# Patient Record
Sex: Male | Born: 1965 | Race: White | Hispanic: No | Marital: Single | Smoking: Current every day smoker
Health system: Southern US, Community
[De-identification: ages and names within clinical notes are randomized; demographics above are authoritative.]

## PROBLEM LIST (undated history)

## (undated) DIAGNOSIS — I10 Essential (primary) hypertension: Secondary | ICD-10-CM

## (undated) DIAGNOSIS — F419 Anxiety disorder, unspecified: Secondary | ICD-10-CM

## (undated) DIAGNOSIS — E785 Hyperlipidemia, unspecified: Secondary | ICD-10-CM

## (undated) DIAGNOSIS — E669 Obesity, unspecified: Secondary | ICD-10-CM

## (undated) DIAGNOSIS — F17200 Nicotine dependence, unspecified, uncomplicated: Secondary | ICD-10-CM

## (undated) DIAGNOSIS — G4730 Sleep apnea, unspecified: Secondary | ICD-10-CM

## (undated) HISTORY — DX: Nicotine dependence, unspecified, uncomplicated: F17.200

## (undated) HISTORY — DX: Anxiety disorder, unspecified: F41.9

## (undated) HISTORY — PX: PARTIAL NEPHRECTOMY: SHX414

## (undated) HISTORY — DX: Sleep apnea, unspecified: G47.30

## (undated) HISTORY — DX: Hyperlipidemia, unspecified: E78.5

## (undated) HISTORY — DX: Obesity, unspecified: E66.9

---

## 2002-01-14 ENCOUNTER — Ambulatory Visit (HOSPITAL_BASED_OUTPATIENT_CLINIC_OR_DEPARTMENT_OTHER): Admission: RE | Admit: 2002-01-14 | Discharge: 2002-01-14 | Payer: Self-pay | Admitting: Internal Medicine

## 2002-03-11 ENCOUNTER — Ambulatory Visit (HOSPITAL_BASED_OUTPATIENT_CLINIC_OR_DEPARTMENT_OTHER): Admission: RE | Admit: 2002-03-11 | Discharge: 2002-03-11 | Payer: Self-pay | Admitting: Internal Medicine

## 2004-06-12 ENCOUNTER — Ambulatory Visit: Payer: Self-pay | Admitting: *Deleted

## 2004-07-17 ENCOUNTER — Ambulatory Visit: Payer: Self-pay | Admitting: Family Medicine

## 2004-10-03 ENCOUNTER — Ambulatory Visit: Payer: Self-pay | Admitting: Family Medicine

## 2005-05-15 ENCOUNTER — Encounter: Admission: RE | Admit: 2005-05-15 | Discharge: 2005-05-15 | Payer: Self-pay | Admitting: Family Medicine

## 2005-05-23 ENCOUNTER — Ambulatory Visit: Payer: Self-pay

## 2006-05-22 ENCOUNTER — Ambulatory Visit: Payer: Self-pay | Admitting: Family Medicine

## 2007-01-27 ENCOUNTER — Ambulatory Visit: Payer: Self-pay | Admitting: Family Medicine

## 2007-09-25 HISTORY — PX: ANKLE SURGERY: SHX546

## 2007-10-14 ENCOUNTER — Ambulatory Visit: Payer: Self-pay | Admitting: Family Medicine

## 2007-10-14 ENCOUNTER — Encounter: Admission: RE | Admit: 2007-10-14 | Discharge: 2007-10-14 | Payer: Self-pay | Admitting: Family Medicine

## 2007-11-20 ENCOUNTER — Ambulatory Visit: Payer: Self-pay | Admitting: Family Medicine

## 2008-01-21 ENCOUNTER — Ambulatory Visit (HOSPITAL_COMMUNITY): Admission: RE | Admit: 2008-01-21 | Discharge: 2008-01-22 | Payer: Self-pay | Admitting: Orthopedic Surgery

## 2008-02-17 ENCOUNTER — Ambulatory Visit: Payer: Self-pay | Admitting: Family Medicine

## 2008-09-22 ENCOUNTER — Ambulatory Visit: Payer: Self-pay | Admitting: Family Medicine

## 2009-01-31 ENCOUNTER — Ambulatory Visit: Payer: Self-pay | Admitting: Family Medicine

## 2009-02-02 ENCOUNTER — Ambulatory Visit: Payer: Self-pay | Admitting: Family Medicine

## 2009-03-09 ENCOUNTER — Ambulatory Visit: Payer: Self-pay | Admitting: Family Medicine

## 2009-06-23 ENCOUNTER — Ambulatory Visit: Payer: Self-pay | Admitting: Family Medicine

## 2010-09-13 ENCOUNTER — Ambulatory Visit: Payer: Self-pay | Admitting: Family Medicine

## 2010-12-12 ENCOUNTER — Encounter: Payer: Self-pay | Admitting: Family Medicine

## 2011-01-11 ENCOUNTER — Encounter: Payer: Self-pay | Admitting: Family Medicine

## 2011-01-17 ENCOUNTER — Encounter: Payer: Self-pay | Admitting: *Deleted

## 2011-01-22 ENCOUNTER — Encounter: Payer: Self-pay | Admitting: Medical

## 2011-01-30 ENCOUNTER — Encounter: Payer: Self-pay | Admitting: Medical

## 2011-01-30 ENCOUNTER — Ambulatory Visit (INDEPENDENT_AMBULATORY_CARE_PROVIDER_SITE_OTHER): Payer: BC Managed Care – PPO | Admitting: Medical

## 2011-01-30 VITALS — BP 110/72 | HR 72 | Ht 66.0 in | Wt 252.0 lb

## 2011-01-30 DIAGNOSIS — E669 Obesity, unspecified: Secondary | ICD-10-CM | POA: Insufficient documentation

## 2011-01-30 DIAGNOSIS — F17200 Nicotine dependence, unspecified, uncomplicated: Secondary | ICD-10-CM

## 2011-01-30 DIAGNOSIS — E785 Hyperlipidemia, unspecified: Secondary | ICD-10-CM

## 2011-01-30 DIAGNOSIS — F430 Acute stress reaction: Secondary | ICD-10-CM | POA: Insufficient documentation

## 2011-01-30 LAB — POCT URINALYSIS DIPSTICK
Bilirubin, UA: NEGATIVE
Glucose, UA: NEGATIVE
Ketones, UA: NEGATIVE
Protein, UA: NEGATIVE
Urobilinogen, UA: NEGATIVE

## 2011-01-30 MED ORDER — EZETIMIBE-SIMVASTATIN 10-80 MG PO TABS: 1.0000 | ORAL_TABLET | Freq: Every day | ORAL | Status: DC

## 2011-01-30 MED ORDER — LISINOPRIL 5 MG PO TABS: 5.0000 mg | ORAL_TABLET | Freq: Every day | ORAL | Status: DC

## 2011-01-30 MED ORDER — SAXAGLIPTIN-METFORMIN ER 2.5-1000 MG PO TB24: 1.0000 | ORAL_TABLET | Freq: Two times a day (BID) | ORAL | Status: DC

## 2011-01-30 NOTE — Progress Notes (Signed)
  Subjective:    Patient ID: Ralph Swanson, male    DOB: 08/18/1966, 45 y.o.   MRN: [id number]  HPI Here today for routine f/u on diabetes, cholesterol, medications.  He was last here 12/11, and at that time had HgbA1C of 10%.  He was changed to Kombiglyze 2.5/1000mg  BID at that time.  He notes that he has tried to improve his diet, but not getting much exercise.  He is past due for eye doctor visit as well.   His main problem in taking care of himself is his stressors.  He lives alone, but takes care of both his elderly parents.  His father's memory is declining, and his mother has poorly controlled diabetes, and will soon be going on dialysis.  He still smokes.  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  His parents have been reluctant to have any aid workers help them.  He works in Air[employer].  He is very stressed given all the different responsibilities he has.  He is limited in exercise due to ankle issues.  Seeing ortho for this.  No other c/o today.  Other prior diabetic medications include Byetta and Glipizide.    Review of Systems  Constitutional: Negative for fever, chills, fatigue and unexpected weight change.  Eyes: Negative for visual disturbance.  Respiratory: Negative for cough and shortness of breath.   Cardiovascular: Negative for chest pain, palpitations and leg swelling.  Gastrointestinal: Negative for nausea, vomiting, abdominal pain, diarrhea and constipation.  Genitourinary: Negative for dysuria, urgency and frequency.  Neurological: Negative for weakness and numbness.       Objective:   Physical Exam  Constitutional: He appears well-developed and well-nourished. No distress.       obese  HENT:  Mouth/Throat: Oropharynx is clear and moist.  Eyes: Conjunctivae and EOM are normal. Pupils are equal, round, and reactive to light.  Neck: Normal range of motion. Neck supple. No JVD present. No thyromegaly present.  Cardiovascular: Normal rate, regular rhythm and  intact distal pulses.   No murmur heard. Pulmonary/Chest: Effort normal and breath sounds normal. He has no wheezes. He has no rales.  Abdominal: Soft. Bowel sounds are normal. He exhibits no distension and no mass. There is no tenderness. There is no rebound and no guarding.  Musculoskeletal: He exhibits no edema.  Lymphadenopathy:    He has no cervical adenopathy.  Neurological:       Foot sensation with monofilament exam normal  Skin: Skin is warm and dry.       No foot lesions          Assessment & Plan:   Encounter Diagnoses  Name Primary?  . Type II or unspecified type diabetes mellitus without mention of complication, uncontrolled Yes  . Hyperlipidemia   . Tobacco use disorder   . Obesity   . Acute stress reaction      Diabetes - improved, but we need to work on your weight and diet and exercise.  Get 30-40 minutes exercise daily, eat healthy - less fast food, red meat; more fruits, vegetables, smaller portions.  Get a yearly diabetic eye exam soon.  Check feet for sores daily.  Check fasting glucose daily and keep a record of these.  Continue same medications for now.  Try and cut back on tobacco and eventually quit.  Return in 3 months for recheck.

## 2011-01-30 NOTE — Progress Notes (Signed)
  Subjective:    Patient ID: Ralph Swanson, male    DOB: 12-Feb-1966, 45 y.o.   MRN: [id number]  HPI    Review of Systems     Objective:   Physical Exam        Assessment & Plan:

## 2011-01-30 NOTE — Patient Instructions (Signed)
Diabetes - improved, but we need to work on your weight and diet and exercise.    Get 30-40 minutes exercise daily, eat healthy - less fast food, red meat; more fruits, vegetables, smaller portions.    Get a yearly diabetic eye exam soon.  Check feet for sores daily.  Check fasting glucose daily and keep a record of these.    Continue same medications for now.    Return in 3 months for recheck.

## 2011-01-31 LAB — LIPID PANEL
Cholesterol: 85 mg/dL (ref 0–200)
Total CHOL/HDL Ratio: 2.7 Ratio
VLDL: 19 mg/dL (ref 0–40)

## 2011-01-31 LAB — COMPREHENSIVE METABOLIC PANEL
ALT: 14 U/L (ref 0–53)
AST: 17 U/L (ref 0–37)
Calcium: 9.1 mg/dL (ref 8.4–10.5)
Creat: 0.69 mg/dL (ref 0.40–1.50)
Potassium: 4.2 mEq/L (ref 3.5–5.3)

## 2011-02-01 ENCOUNTER — Telehealth: Payer: Self-pay | Admitting: *Deleted

## 2011-02-01 NOTE — Telephone Encounter (Addendum)
Message copied by Ellsworth Lennox on Thu Feb 01, 2011  9:07 AM ------      Message from: Aleen Campi, DAVID      Created: Wed Jan 31, 2011  2:43 PM       Similar to last labs, his good chol is low, bad chol is ok.  Liver, kidneys, lytes ok.  C/t same meds, work on diet and exercise, recheck 43mo.     Pt. Informed of lab results.  Advised patient to continue same medications, work on diet and exercise.  Pt scheduled on 05-02-11 for a 3 month follow up.  CM,LPN

## 2011-02-06 NOTE — Op Note (Signed)
NAMEAVIGDOR, DOLLAR                 ACCOUNT NO.:  [id number]   MEDICAL RECORD NO.:  [id number]          PATIENT TYPE:  OIB   LOCATION:  5030                         FACILITY:  [facility]   PHYSICIAN:  Nadara Mustard, MD     DATE OF BIRTH:  1966-09-13   DATE OF PROCEDURE:  01/21/2008  DATE OF DISCHARGE:                               OPERATIVE REPORT   PREOPERATIVE DIAGNOSIS:  Osteoarthritis right ankle with impingement  syndrome.   POSTOPERATIVE DIAGNOSIS:  Osteoarthritis right ankle with impingement  syndrome.   PROCEDURE:  Right ankle arthroscopy with debridement and decompression.   SURGEON:  Nadara Mustard, MD   ANESTHESIA:  General.   ESTIMATED BLOOD LOSS:  Minimal.   ANTIBIOTICS:  None.   DRAINS:  None.   COMPLICATIONS:  None.   TOURNIQUET TIME:  Esmarch at the ankle for approximately 15 minutes.   DISPOSITION:  To PACU in stable condition.   INDICATIONS FOR PROCEDURE:  The patient is a 45 year old gentleman with  impingement and arthritis of his right ankle.  He has failed  conservative care for his pain with activities of daily living and  presents at this time for arthroscopic intervention.  Risks and benefits  were discussed including infection, neurovascular injury, persistent  pain, and need for additional surgery.  The patient states he  understands and wished to proceed at this time.   DESCRIPTION OF PROCEDURE:  The patient was brought to OR room 10 and  underwent general anesthetic.  After adequate level of anesthesia was  obtained, the patient's right lower extremity was prepped using  DuraPrep, draped into a sterile field and the sling for the ankle  distractor was used to apply 10 pounds of ankle distraction.  An  anteromedial portal was made and localized with an 18-gauge spinal.  The  joint was infused with saline.  The skin was incised.  Blunt dissection  was carried down to the joint and the blunt trocar was used to insert  the scope.  The lateral  portal was used with light from the inside,  identifying the neurovascular bundles.  The skin was incised, blunt  dissection was carried down to the capsule and a blunt trocar was used  to make the anterior lateral portal.  Visualization showed a significant  amount of synovitis.  This was debrided with the shaver and the vapor  wand.  Further debridement was performed with the shaver.  The medial  and lateral gutters were cleansed, articular cartilage was probed and  stable.  No full-thickness articular cartilage defect.  After  debridement and decompression, the instruments were removed.  The  portals were closed using 3-0 nylon and wounds were covered with Adaptic  orthopedic sponges, sterile Webril, and a Coban dressing.  The patient  was extubated and taken to PACU in stable condition.  Plan for 24-hour  observation for his history of sleep apnea with CPAP per respiratory  therapy.  Discharge in the morning.      Nadara Mustard, MD  Electronically Signed     MVD/MEDQ  D:  01/21/2008  T:  01/22/2008  Job:  [id number]

## 2011-02-26 ENCOUNTER — Other Ambulatory Visit: Payer: Self-pay | Admitting: Medical

## 2011-02-26 DIAGNOSIS — E785 Hyperlipidemia, unspecified: Secondary | ICD-10-CM

## 2011-02-26 DIAGNOSIS — E119 Type 2 diabetes mellitus without complications: Secondary | ICD-10-CM

## 2011-02-26 MED ORDER — SAXAGLIPTIN-METFORMIN ER 2.5-1000 MG PO TB24: 1.0000 | ORAL_TABLET | Freq: Two times a day (BID) | ORAL | Status: DC

## 2011-02-26 MED ORDER — EZETIMIBE-SIMVASTATIN 10-80 MG PO TABS: 1.0000 | ORAL_TABLET | Freq: Every day | ORAL | Status: DC

## 2011-02-26 NOTE — Telephone Encounter (Signed)
Was given written rx but has to go to [facility]. Took rx to local pharmacy but ins co will not let him fill long term rx's locally

## 2011-02-27 ENCOUNTER — Other Ambulatory Visit: Payer: Self-pay | Admitting: Medical

## 2011-02-27 DIAGNOSIS — E119 Type 2 diabetes mellitus without complications: Secondary | ICD-10-CM

## 2011-02-27 MED ORDER — SAXAGLIPTIN-METFORMIN ER 2.5-1000 MG PO TB24: 1.0000 | ORAL_TABLET | Freq: Two times a day (BID) | ORAL | Status: DC

## 2011-05-02 ENCOUNTER — Ambulatory Visit: Payer: BC Managed Care – PPO | Admitting: Medical

## 2011-06-19 LAB — BASIC METABOLIC PANEL
BUN: 12
CO2: 27
Chloride: 103
Creatinine, Ser: 0.71
GFR calc non Af Amer: >60
Glucose, Bld: 94
Potassium: 4.2
Sodium: 139

## 2011-06-19 LAB — CBC
HCT: 45.7
MCV: 94.1
Platelets: 218

## 2011-06-20 ENCOUNTER — Telehealth: Payer: Self-pay | Admitting: Medical

## 2011-06-20 ENCOUNTER — Ambulatory Visit (INDEPENDENT_AMBULATORY_CARE_PROVIDER_SITE_OTHER): Payer: BC Managed Care – PPO | Admitting: Medical

## 2011-06-20 ENCOUNTER — Encounter: Payer: Self-pay | Admitting: Medical

## 2011-06-20 VITALS — BP 122/70 | HR 66 | Temp 98.1°F | Resp 18 | Ht <= 58 in | Wt 248.0 lb

## 2011-06-20 DIAGNOSIS — R809 Proteinuria, unspecified: Secondary | ICD-10-CM

## 2011-06-20 DIAGNOSIS — F17200 Nicotine dependence, unspecified, uncomplicated: Secondary | ICD-10-CM

## 2011-06-20 DIAGNOSIS — E785 Hyperlipidemia, unspecified: Secondary | ICD-10-CM

## 2011-06-20 DIAGNOSIS — E669 Obesity, unspecified: Secondary | ICD-10-CM

## 2011-06-20 DIAGNOSIS — E119 Type 2 diabetes mellitus without complications: Secondary | ICD-10-CM

## 2011-06-20 DIAGNOSIS — Z23 Encounter for immunization: Secondary | ICD-10-CM

## 2011-06-20 LAB — POCT URINALYSIS DIPSTICK
Blood, UA: NEGATIVE
Spec Grav, UA: 1.025

## 2011-06-20 MED ORDER — SAXAGLIPTIN-METFORMIN ER 5-1000 MG PO TB24: 1.0000 | ORAL_TABLET | Freq: Two times a day (BID) | ORAL | Status: DC

## 2011-06-20 MED ORDER — LISINOPRIL 5 MG PO TABS: 5.0000 mg | ORAL_TABLET | Freq: Every day | ORAL | Status: DC

## 2011-06-20 MED ORDER — GLIPIZIDE 5 MG PO TABS: 5.0000 mg | ORAL_TABLET | Freq: Two times a day (BID) | ORAL | Status: DC

## 2011-06-20 NOTE — Patient Instructions (Addendum)
Your diabetes marker was 14.0% today.  This is not good.   I want you to check your glucose EVERY day, especially the fasting morning reading.  Keep a record of these in a notebook and bring them next visit in 3 months.  The goal is morning glucose <130.   I increased your Kombiglyze to 01/999 twice daily.  I added Glipizide 5mg  twice daily.    I want you exercising DAILY.  Watch your diet carefully.  Eat 3-5 snack size meals daily including fruits, vegetables, and lean meat such as [place], chicken, and fish.  Avoid large portions, avoid lots of bread/rice/cereal etc.  Avoid fast food, sweet tea, cola, etc.     I signed you up for the diabetes education session in October.

## 2011-06-20 NOTE — Progress Notes (Signed)
Subjective:   HPI  Ralph Swanson is a 45 y.o. male who presents for recheck.   Last saw him in 01/30/11 for general recheck.    Here for recheck on diabetes and weight.  Last visit he had improved with his diet and medication, had just been started on Kombiglyze 2.01/999 BID prior to last visit.  However, he takes care of his elderly parents, and things continue to be busy and hectic.  Since last visit his mother has had pacemaker put in, has fistula, had colonoscopy, and father ended up also having to have pacemaker as well.  Brother just retired and is coming down for a month to help with parents.  He reports that he has not done a good job with his diet, not exercising at all, but he is taking his medications.  Checks his feet daily, but he does not check his glucose regularly at all. He does have a glucometer and supplies to check his sugar.  He has a treadmill in storage, just has not had time to get out and exercise. No other c/o.  The following portions of the patient's history were reviewed and updated as appropriate: allergies, current medications, past family history, past medical history, past social history, past surgical history and problem list.  Past Medical History  Diagnosis Date  . Diabetes mellitus   . Sleep apnea   . Obesity   . Dyslipidemia   . Smoker    No past surgical history on file.  Current Outpatient Prescriptions on File Prior to Visit  Medication Sig Dispense Refill  . aspirin 81 MG tablet Take 81 mg by mouth daily.        Marland Kitchen ezetimibe-simvastatin (VYTORIN) 10-80 MG per tablet Take 1 tablet by mouth at bedtime.  30 tablet  5  . Multiple Vitamin (MULTIVITAMIN) tablet Take 1 tablet by mouth daily.          Review of Systems Constitutional: denies fever, chills, sweats, unexpected weight change, anorexia, fatigue Cardiology: denies chest pain, palpitations, edema Respiratory: denies cough, shortness of breath, wheezing Gastroenterology: denies abdominal pain,  nausea, vomiting, diarrhea, constipation Musculoskeletal: denies arthralgias, myalgias, joint swelling, back pain Ophthalmology: denies vision changes Urology: denies dysuria, difficulty urinating, hematuria, urinary frequency, urgency Neurology: no headache, weakness, tingling, numbness Psychology: denies depressed mood, agitation, sleep problems     Objective:   Physical Exam  General appearance: alert, no distress, WD/WN, white male, obese Skin: no foot lesions Neck: supple, no lymphadenopathy, no thyromegaly, no masses, no bruits Heart: RRR, normal S1, S2, no murmurs Lungs: CTA bilaterally, no wheezes, rhonchi, or rales Extremities: no edema, no cyanosis, no clubbing Pulses: 2+ symmetric, upper and lower extremities, normal cap refill Neurological: normal foot sensation with monofilament   Assessment :    Encounter Diagnoses  Name Primary?  . Type II or unspecified type diabetes mellitus without mention of complication, not stated as uncontrolled Yes  . Obesity   . Need for prophylactic vaccination and inoculation against influenza   . Hyperlipidemia   . Tobacco use disorder   . Microalbuminuria      Plan:    Diabetes Mellitus Type II - unfortunately, his HgbA1C has jumped back up to 14%.  He was 10% in December, had improved with diet and medication compliance to 7.5% last visit, but in the last few months, he has not been compliant with diet or exercise.   We discussed the possibility of complications, the need to really get a handle on his diabetes.  He agrees to see diabetic education counselor here in October, and I advised that he needs to get exercise every day, really has to get a handle on his diet, and we made some medication modifications today.   I suggested we go ahead and start insulin, but he declines.   We discussed the various types of medications for diabetes.  I will increase his Kombiglyze to 01/999 twice a day, and I'll add Glucotrol 5 mg twice a day.  Discussed the possibility of hypoglycemic episodes, particularly if he is skipping meals or doesn't eat at times.  I advise that I want him checking his sugars every single day, keep a log of this in a note book, bring numbers back in next visit.  Goal is less than 130 in the morning.  Recheck in 3 months.  Obesity-advise diet changes, exercise.  VIS and flu vaccine given today. He reports having the pneumococcal vaccine in the past but can't recall the date.  Hyperlipidemia-continue same medicines  Tobacco use-continue efforts to stop smoking  Microalbuminuria-12/11 on labs, continue lisinopril

## 2011-06-21 ENCOUNTER — Telehealth: Payer: Self-pay | Admitting: Family Medicine

## 2011-06-22 ENCOUNTER — Telehealth: Payer: Self-pay | Admitting: Medical

## 2011-06-25 ENCOUNTER — Telehealth: Payer: Self-pay | Admitting: Family Medicine

## 2011-06-25 ENCOUNTER — Other Ambulatory Visit: Payer: Self-pay | Admitting: Medical

## 2011-06-25 MED ORDER — SAXAGLIPTIN-METFORMIN ER 5-1000 MG PO TB24: 1.0000 | ORAL_TABLET | Freq: Every day | ORAL | Status: DC

## 2011-06-25 NOTE — Telephone Encounter (Signed)
Done

## 2011-06-25 NOTE — Telephone Encounter (Signed)
DONE

## 2011-06-25 NOTE — Telephone Encounter (Addendum)
Message copied by Janeice Robinson on Mon Jun 25, 2011  9:06 AM ------      Message from: Allen, Kermit Balo      Created: Mon Jun 25, 2011  8:31 AM       The max dose on Kombiglyze 01/999 XR is once daily, thus, I adjusted this per his pharmacy request.  So he will be taking Kombiglyze 01/999 XR once daily, Glucotrol 5mg  BID, both of those for diabetes, along with his other medications.              I want him to check glucose every morning before breakfast, write these numbers down, and send/mail/fax/drop off glucose readings in 3-4 wk.    Patient was notified of the change in his medication and to F/U on his glucose readings. cls

## 2011-06-25 NOTE — Telephone Encounter (Signed)
done

## 2011-07-11 NOTE — Telephone Encounter (Signed)
dt ?

## 2011-10-16 ENCOUNTER — Telehealth: Payer: Self-pay | Admitting: Internal Medicine

## 2011-10-17 ENCOUNTER — Other Ambulatory Visit: Payer: Self-pay | Admitting: Medical

## 2011-10-17 DIAGNOSIS — E785 Hyperlipidemia, unspecified: Secondary | ICD-10-CM

## 2011-10-17 MED ORDER — EZETIMIBE-SIMVASTATIN 10-80 MG PO TABS: 1.0000 | ORAL_TABLET | Freq: Every day | ORAL | Status: DC

## 2011-10-17 MED ORDER — SAXAGLIPTIN-METFORMIN ER 5-1000 MG PO TB24: 1.0000 | ORAL_TABLET | Freq: Two times a day (BID) | ORAL | Status: DC

## 2011-10-17 MED ORDER — LISINOPRIL 5 MG PO TABS: 5.0000 mg | ORAL_TABLET | Freq: Every day | ORAL | Status: DC

## 2011-10-17 MED ORDER — GLIPIZIDE 5 MG PO TABS: 5.0000 mg | ORAL_TABLET | Freq: Two times a day (BID) | ORAL | Status: DC

## 2011-10-17 NOTE — Telephone Encounter (Signed)
done

## 2011-11-09 ENCOUNTER — Encounter: Payer: BC Managed Care – PPO | Admitting: Medical

## 2011-11-14 ENCOUNTER — Telehealth: Payer: Self-pay | Admitting: Family Medicine

## 2011-11-14 NOTE — Telephone Encounter (Signed)
Done

## 2011-11-23 ENCOUNTER — Encounter: Payer: Self-pay | Admitting: Medical

## 2011-11-23 ENCOUNTER — Ambulatory Visit (INDEPENDENT_AMBULATORY_CARE_PROVIDER_SITE_OTHER): Payer: BC Managed Care – PPO | Admitting: Medical

## 2011-11-23 VITALS — BP 112/68 | HR 72 | Temp 98.3°F | Resp 16 | Wt 269.0 lb

## 2011-11-23 DIAGNOSIS — E669 Obesity, unspecified: Secondary | ICD-10-CM

## 2011-11-23 DIAGNOSIS — M25579 Pain in unspecified ankle and joints of unspecified foot: Secondary | ICD-10-CM

## 2011-11-23 DIAGNOSIS — R454 Irritability and anger: Secondary | ICD-10-CM

## 2011-11-23 DIAGNOSIS — E785 Hyperlipidemia, unspecified: Secondary | ICD-10-CM

## 2011-11-23 DIAGNOSIS — M25571 Pain in right ankle and joints of right foot: Secondary | ICD-10-CM

## 2011-11-23 DIAGNOSIS — Z87891 Personal history of nicotine dependence: Secondary | ICD-10-CM

## 2011-11-23 LAB — POCT GLYCOSYLATED HEMOGLOBIN (HGB A1C): Hemoglobin A1C: 7.2

## 2011-11-23 MED ORDER — SERTRALINE HCL 25 MG PO TABS: 25.0000 mg | ORAL_TABLET | Freq: Every day | ORAL | Status: DC

## 2011-11-23 MED ORDER — GLIPIZIDE 5 MG PO TABS: 5.0000 mg | ORAL_TABLET | Freq: Two times a day (BID) | ORAL | Status: DC

## 2011-11-23 MED ORDER — LISINOPRIL 5 MG PO TABS: 5.0000 mg | ORAL_TABLET | Freq: Every day | ORAL | Status: DC

## 2011-11-23 MED ORDER — DICLOFENAC SODIUM 75 MG PO TBEC: 75.0000 mg | DELAYED_RELEASE_TABLET | Freq: Two times a day (BID) | ORAL | Status: DC

## 2011-11-23 MED ORDER — SIMVASTATIN 40 MG PO TABS: 40.0000 mg | ORAL_TABLET | Freq: Every day | ORAL | Status: DC

## 2011-11-23 MED ORDER — SAXAGLIPTIN-METFORMIN ER 5-1000 MG PO TB24: 1.0000 | ORAL_TABLET | Freq: Every day | ORAL | Status: DC

## 2011-11-23 NOTE — Patient Instructions (Signed)
We will refer you for your ankle pain.  Lets recheck, fasting in 6 weeks.

## 2011-11-23 NOTE — Progress Notes (Signed)
Subjective:   HPI  Ralph Swanson is a 46 y.o. male who presents for recheck on chronic issues - diabetes, cholesterol, tobacco, weight.  As of last visit in May, he did not attend the diabetes educational counseling session as he agreed to.  He has gained weight, but he finally was able to stop smoking.  Stopped smoking 2 months ago.  Since stopping tobacco, he has been more irritable.  He says he is making diet changes.   Eating more celery, almonds, healthier choices.  Due to insurance issues, he has been out of Vytorin 2 mo and running out of Kombiglyze.  3 years ago had surgery on his ankle .  Since then he has had gradual return to the same pain.   2 nights ago couldn't stand due to right ankle.  Feels like bone grinding on bone.  Feels inflamed.  When this hurts, he shifts his weight then starts getting pain in left hip, and feels like he can't move.  Certainly can't exercise currently.   He had been on Zoloft prior for irritability and would like to try this again.    No other c/o.  The following portions of the patient's history were reviewed and updated as appropriate: allergies, current medications, past family history, past medical history, past social history, past surgical history and problem list.  Past Medical History  Diagnosis Date  . Diabetes mellitus   . Sleep apnea   . Obesity   . Dyslipidemia   . Smoker     No Known Allergies   Review of Systems ROS reviewed and was negative other than noted in HPI or above.    Objective:   Physical Exam  General appearance: alert, no distress, WD/WN Oral cavity: MMM, no lesions Neck: supple, no lymphadenopathy, no thyromegaly, no masses Heart: RRR, normal S1, S2, no murmurs Lungs: CTA bilaterally, no wheezes, rhonchi, or rales Abdomen: +bs, soft, non tender, non distended, no masses, no hepatomegaly, no splenomegaly Pulses: 2+ symmetric, upper and lower extremities, normal cap refill MSK: flat footed, right ankle non  particularly tender on palpation, but pain with ambulation and weight bearing, no obvious swelling.  Left foot normal exam other than flat foot.  Rest of LE exam unremarkable.  Neuro: normal monofilament exam   Assessment and Plan :     Encounter Diagnoses  Name Primary?  . Type II or unspecified type diabetes mellitus without mention of complication, uncontrolled Yes  . Hyperlipidemia   . Obesity   . Former smoker   . Ankle pain, right   . Irritability    Diabetes type II - HgbA1C 7.2%.  C/t same medication, including Glucotrol 5 BID and Kombiglyze XR 01/999 daily unless we can't get it approved.  Needs to work on diet and exercise.    Lipids - change to Simvastatin 40mg  due to cost, recheck 6wk.  Congratulated him on smoking cessation.  Ankle pain/foot pain - crutches for now, referral to podiatry.  Reviewed prior ortho notes.  Voltaren script, he has crutches, note for work to allow crutches.   Irritability - begin Zoloft, recheck 6 wk.

## 2011-11-27 ENCOUNTER — Telehealth: Payer: Self-pay | Admitting: Medical

## 2011-12-14 ENCOUNTER — Telehealth: Payer: Self-pay | Admitting: Medical

## 2011-12-17 ENCOUNTER — Telehealth: Payer: Self-pay | Admitting: Medical

## 2011-12-17 NOTE — Telephone Encounter (Signed)
DONE

## 2011-12-18 ENCOUNTER — Other Ambulatory Visit: Payer: Self-pay | Admitting: Internal Medicine

## 2011-12-18 MED ORDER — SERTRALINE HCL 25 MG PO TABS: 25.0000 mg | ORAL_TABLET | Freq: Every day | ORAL | Status: DC

## 2011-12-18 NOTE — Telephone Encounter (Signed)
Just did it as a 90 day supply

## 2011-12-19 ENCOUNTER — Other Ambulatory Visit: Payer: Self-pay | Admitting: Medical

## 2011-12-19 MED ORDER — DICLOFENAC SODIUM 75 MG PO TBEC: 75.0000 mg | DELAYED_RELEASE_TABLET | Freq: Two times a day (BID) | ORAL | Status: DC

## 2011-12-20 ENCOUNTER — Telehealth: Payer: Self-pay | Admitting: Internal Medicine

## 2011-12-24 ENCOUNTER — Other Ambulatory Visit: Payer: Self-pay | Admitting: Medical

## 2011-12-24 MED ORDER — DICLOFENAC SODIUM 75 MG PO TBEC: 75.0000 mg | DELAYED_RELEASE_TABLET | Freq: Two times a day (BID) | ORAL | Status: DC

## 2011-12-24 NOTE — Telephone Encounter (Signed)
Patient was notified of the medication that was sent to the pharmacy. CLS

## 2011-12-24 NOTE — Telephone Encounter (Signed)
i sent the 90 day supply, but I recommend he make these last longer than 3 months.   Taking these every single day BID or any other antiinflammatory such as Aleve, Ibuprofen, etc., can put you at risk for a bleed in the stomach/GI tract for example.    We can discuss further at next visit as well as safer options.

## 2012-01-04 ENCOUNTER — Encounter: Payer: Self-pay | Admitting: Medical

## 2012-01-04 ENCOUNTER — Ambulatory Visit (INDEPENDENT_AMBULATORY_CARE_PROVIDER_SITE_OTHER): Payer: BC Managed Care – PPO | Admitting: Medical

## 2012-01-04 VITALS — BP 120/70 | HR 76 | Temp 98.1°F | Resp 16 | Wt 275.0 lb

## 2012-01-04 DIAGNOSIS — F411 Generalized anxiety disorder: Secondary | ICD-10-CM

## 2012-01-04 DIAGNOSIS — M25579 Pain in unspecified ankle and joints of unspecified foot: Secondary | ICD-10-CM

## 2012-01-04 DIAGNOSIS — E785 Hyperlipidemia, unspecified: Secondary | ICD-10-CM

## 2012-01-04 DIAGNOSIS — Z79899 Other long term (current) drug therapy: Secondary | ICD-10-CM

## 2012-01-04 DIAGNOSIS — M25571 Pain in right ankle and joints of right foot: Secondary | ICD-10-CM

## 2012-01-04 DIAGNOSIS — F419 Anxiety disorder, unspecified: Secondary | ICD-10-CM

## 2012-01-04 MED ORDER — DICLOFENAC SODIUM 75 MG PO TBEC: 75.0000 mg | DELAYED_RELEASE_TABLET | Freq: Two times a day (BID) | ORAL | Status: AC

## 2012-01-04 MED ORDER — SERTRALINE HCL 50 MG PO TABS: 50.0000 mg | ORAL_TABLET | Freq: Every day | ORAL | Status: DC

## 2012-01-05 ENCOUNTER — Encounter: Payer: Self-pay | Admitting: Medical

## 2012-01-05 LAB — COMPREHENSIVE METABOLIC PANEL
ALT: 22 U/L (ref 0–53)
AST: 17 U/L (ref 0–37)
Albumin: 4.3 g/dL (ref 3.5–5.2)
CO2: 24 mEq/L (ref 19–32)
Calcium: 8.8 mg/dL (ref 8.4–10.5)
Chloride: 104 mEq/L (ref 96–112)
Creat: 0.73 mg/dL (ref 0.50–1.35)
Potassium: 4.6 mEq/L (ref 3.5–5.3)
Sodium: 139 mEq/L (ref 135–145)
Total Protein: 6.5 g/dL (ref 6.0–8.3)

## 2012-01-05 LAB — LIPID PANEL
Cholesterol: 131 mg/dL (ref 0–200)
HDL: 33 mg/dL — ABNORMAL LOW (ref >39)
LDL Cholesterol: 68 mg/dL (ref 0–99)
Total CHOL/HDL Ratio: 4 ratio
Triglycerides: 150 mg/dL — ABNORMAL HIGH (ref <150)
VLDL: 30 mg/dL (ref 0–40)

## 2012-01-05 NOTE — Progress Notes (Signed)
Subjective:   HPI  Ralph Swanson is a 46 y.o. male who presents for 6 wk f/u.  Last visit we started him back on Zoloft for anxiety.  He continues to deal with multiple stressors; takes care of his parents, has remodeling project at his home, anxiety with his own health, job stress.  He notes that the Zoloft helps, but thinks it probable needs to be higher dose.    Last visit we stopped Vytorin and switched to Simvastatin solely to help with cost.  Here for recheck on labs for this.  He saw podiatry for his chronic right ankle pain recently, had steroid injection, was measured for orthotics and has f/u pending.  He had a really good visit and feels comfortable with the podiatrist.  He also does well with Voltaren and wants to c/t this.    No other c/o.  The following portions of the patient's history were reviewed and updated as appropriate: allergies, current medications, past family history, past medical history, past social history, past surgical history and problem list.  Past Medical History  Diagnosis Date  . Diabetes mellitus   . Sleep apnea   . Obesity   . Dyslipidemia   . Smoker   . Anxiety     No Known Allergies   Review of Systems ROS reviewed and was negative other than noted in HPI or above.    Objective:   Physical Exam  General appearance: alert, no distress, WD/WN Heart: RRR, normal S1, S2, no murmurs Lungs: CTA bilaterally, no wheezes, rhonchi, or rales Pulses: 2+ symmetric  Assessment and Plan :     Encounter Diagnoses  Name Primary?  . Hyperlipidemia Yes  . Anxiety   . Ankle pain, right   . Encounter for long-term (current) use of other medications    Hyperlipidemia - labs today, goal LDL 80.    Anxiety - improving, increase Zoloft to 50mg   Ankle pain - currently followed by Podiatry, has f/u planned  High risk medication - dicussed risks/benefits of Voltaren/NSAIDs for his chronic ankle pain.  Discussed other medication options.  He wants to keep  Voltaren on board for prn use at this time, but hopefully with the new orthotics from podiatry he won't continue needing the NSAID routinely.   F/u pending labs.

## 2012-06-09 ENCOUNTER — Other Ambulatory Visit: Payer: Self-pay | Admitting: Family Medicine

## 2012-06-09 ENCOUNTER — Telehealth: Payer: Self-pay | Admitting: Internal Medicine

## 2012-06-09 MED ORDER — LISINOPRIL 5 MG PO TABS: 5.0000 mg | ORAL_TABLET | Freq: Every day | ORAL | Status: AC

## 2012-06-09 MED ORDER — LISINOPRIL 5 MG PO TABS: 5.0000 mg | ORAL_TABLET | Freq: Every day | ORAL | Status: DC

## 2012-06-09 MED ORDER — GLIPIZIDE 5 MG PO TABS: 5.0000 mg | ORAL_TABLET | Freq: Two times a day (BID) | ORAL | Status: DC

## 2012-06-09 NOTE — Telephone Encounter (Signed)
Called in med to pharamcy

## 2012-06-10 ENCOUNTER — Other Ambulatory Visit: Payer: Self-pay | Admitting: Family Medicine

## 2012-06-10 MED ORDER — SERTRALINE HCL 50 MG PO TABS: 50.0000 mg | ORAL_TABLET | Freq: Every day | ORAL | Status: AC

## 2012-06-10 MED ORDER — SAXAGLIPTIN-METFORMIN ER 5-1000 MG PO TB24: 1.0000 | ORAL_TABLET | Freq: Every day | ORAL | Status: DC

## 2012-06-10 NOTE — Telephone Encounter (Signed)
PLS SIGN OFF ON MEDS 

## 2012-06-17 ENCOUNTER — Telehealth: Payer: Self-pay | Admitting: Family Medicine

## 2012-06-17 NOTE — Telephone Encounter (Signed)
PATIENT HAD CALLED AND SAID HE WAS LOOSING HIS INSURANCE AND IT WOULD BE CHEAPER FOR HIM TO BUY PRAVASTIN INSTEAD OF SIMIVASTIN. PATIENT WOULD LIKE TO KNOW IF HE CAN CHANGE TO PRAVASTIN AND IF SO CAN YOU SEND IN A 90 DAY SUPPLY TO [facility] ON [address]. CLS

## 2012-06-18 ENCOUNTER — Other Ambulatory Visit: Payer: Self-pay | Admitting: Medical

## 2012-06-18 MED ORDER — PRAVASTATIN SODIUM 80 MG PO TABS: 80.0000 mg | ORAL_TABLET | Freq: Every day | ORAL | Status: DC

## 2012-06-18 NOTE — Telephone Encounter (Signed)
Pravastatin sent so stop simvastatin once he runs out.  Needs OV in October, fasting.

## 2012-06-18 NOTE — Telephone Encounter (Signed)
Patient is aware of the medication sent to the pharmacy and to follow up next month. CLS

## 2012-06-23 ENCOUNTER — Telehealth: Payer: Self-pay | Admitting: Internal Medicine

## 2012-06-23 ENCOUNTER — Other Ambulatory Visit: Payer: Self-pay | Admitting: Medical

## 2012-06-23 NOTE — Telephone Encounter (Signed)
i sent pravastatin 80mg  [facility] already, and this is equivalent to the dose of simvastatin he was on.   Going to prvastatin 40mg  would be lowering his dose in general.     Did he go to get the 80mg  from pharmacy?  Is there an issue?  We will need to recheck lipids in 93mo after medication changes.

## 2012-06-24 ENCOUNTER — Other Ambulatory Visit: Payer: Self-pay | Admitting: Medical

## 2012-06-24 MED ORDER — PRAVASTATIN SODIUM 40 MG PO TABS: ORAL_TABLET | ORAL | Status: DC

## 2012-06-24 NOTE — Telephone Encounter (Signed)
Patient states that it is cheaper to get the RX at the 40 mg dose than buying it at the 80 mg dose. He said he will take 2 of the 40 mg to get the 80 mg, but it is cheaper with the lower dose. CLS

## 2012-06-24 NOTE — Telephone Encounter (Signed)
So does this mean we need to call out pravastatin 40, but taking 2 daily?

## 2012-06-24 NOTE — Telephone Encounter (Signed)
YES, RESEND THE RX THAT WAY. CLS

## 2014-02-02 ENCOUNTER — Telehealth: Payer: Self-pay | Admitting: Family Medicine

## 2014-02-02 NOTE — Telephone Encounter (Signed)
[facility] request records

## 2014-04-27 ENCOUNTER — Encounter (HOSPITAL_COMMUNITY): Payer: Self-pay | Admitting: Emergency Medicine

## 2014-04-27 ENCOUNTER — Emergency Department (HOSPITAL_COMMUNITY)
Admission: EM | Admit: 2014-04-27 | Discharge: 2014-04-27 | Disposition: A | Discharge status: Home / Self Care | Payer: BC Managed Care – PPO | Attending: Emergency Medicine | Admitting: Emergency Medicine

## 2014-04-27 ENCOUNTER — Emergency Department (HOSPITAL_COMMUNITY): Payer: BC Managed Care – PPO

## 2014-04-27 DIAGNOSIS — E669 Obesity, unspecified: Secondary | ICD-10-CM | POA: Insufficient documentation

## 2014-04-27 DIAGNOSIS — F17200 Nicotine dependence, unspecified, uncomplicated: Secondary | ICD-10-CM | POA: Insufficient documentation

## 2014-04-27 DIAGNOSIS — Z7982 Long term (current) use of aspirin: Secondary | ICD-10-CM | POA: Insufficient documentation

## 2014-04-27 DIAGNOSIS — E785 Hyperlipidemia, unspecified: Secondary | ICD-10-CM | POA: Insufficient documentation

## 2014-04-27 DIAGNOSIS — Z79899 Other long term (current) drug therapy: Secondary | ICD-10-CM | POA: Insufficient documentation

## 2014-04-27 DIAGNOSIS — R109 Unspecified abdominal pain: Secondary | ICD-10-CM | POA: Insufficient documentation

## 2014-04-27 DIAGNOSIS — E119 Type 2 diabetes mellitus without complications: Secondary | ICD-10-CM | POA: Insufficient documentation

## 2014-04-27 LAB — CBC WITH DIFFERENTIAL/PLATELET
BASOS PCT: 0 % (ref 0–1)
Basophils Absolute: 0 10*3/uL (ref 0.0–0.1)
EOS ABS: 0.2 10*3/uL (ref 0.0–0.7)
EOS PCT: 2 % (ref 0–5)
HEMATOCRIT: 46.7 % (ref 39.0–52.0)
HEMOGLOBIN: 15.8 g/dL (ref 13.0–17.0)
LYMPHS ABS: 2.6 10*3/uL (ref 0.7–4.0)
Lymphocytes Relative: 24 % (ref 12–46)
MCH: 32 pg (ref 26.0–34.0)
MCHC: 33.8 g/dL (ref 30.0–36.0)
MCV: 94.7 fL (ref 78.0–100.0)
MONO ABS: 0.7 10*3/uL (ref 0.1–1.0)
MONOS PCT: 7 % (ref 3–12)
NEUTROS PCT: 67 % (ref 43–77)
Neutro Abs: 7.2 10*3/uL (ref 1.7–7.7)
Platelets: 236 10*3/uL (ref 150–400)
RBC: 4.93 MIL/uL (ref 4.22–5.81)
RDW: 13.5 % (ref 11.5–15.5)
WBC: 10.8 10*3/uL — ABNORMAL HIGH (ref 4.0–10.5)

## 2014-04-27 LAB — COMPREHENSIVE METABOLIC PANEL
ALBUMIN: 4.2 g/dL (ref 3.5–5.2)
ALT: 14 U/L (ref 0–53)
ANION GAP: 15 (ref 5–15)
AST: 16 U/L (ref 0–37)
Alkaline Phosphatase: 86 U/L (ref 39–117)
BUN: 17 mg/dL (ref 6–23)
CALCIUM: 9.6 mg/dL (ref 8.4–10.5)
CO2: 22 mEq/L (ref 19–32)
CREATININE: 0.57 mg/dL (ref 0.50–1.35)
Chloride: 103 mEq/L (ref 96–112)
GFR calc non Af Amer: >90 mL/min (ref >90)
GLUCOSE: 126 mg/dL — AB (ref 70–99)
Potassium: 4.5 mEq/L (ref 3.7–5.3)
Sodium: 140 mEq/L (ref 137–147)
TOTAL PROTEIN: 7.6 g/dL (ref 6.0–8.3)
Total Bilirubin: 0.8 mg/dL (ref 0.3–1.2)

## 2014-04-27 LAB — URINALYSIS, ROUTINE W REFLEX MICROSCOPIC
GLUCOSE, UA: NEGATIVE mg/dL
Hgb urine dipstick: NEGATIVE
KETONES UR: 15 mg/dL — AB
LEUKOCYTES UA: NEGATIVE
Nitrite: NEGATIVE
PH: 5 (ref 5.0–8.0)
PROTEIN: 30 mg/dL — AB
Specific Gravity, Urine: 1.037 — ABNORMAL HIGH (ref 1.005–1.030)
Urobilinogen, UA: 0.2 mg/dL (ref 0.0–1.0)

## 2014-04-27 LAB — URINE MICROSCOPIC-ADD ON

## 2014-04-27 LAB — CBG MONITORING, ED: GLUCOSE-CAPILLARY: 122 mg/dL — AB (ref 70–99)

## 2014-04-27 MED ORDER — OXYCODONE-ACETAMINOPHEN 5-325 MG PO TABS: 2.0000 | ORAL_TABLET | Freq: Four times a day (QID) | ORAL | Status: DC | PRN

## 2014-04-27 MED ORDER — HYDROMORPHONE HCL PF 1 MG/ML IJ SOLN
1.0000 mg | Freq: Once | INTRAMUSCULAR | Status: AC
  Administered 2014-04-27: 1 mg via INTRAVENOUS
  Filled 2014-04-27: qty 1

## 2014-04-27 MED ORDER — HYDROMORPHONE HCL PF 1 MG/ML IJ SOLN: 1.0000 mg | Freq: Once | INTRAMUSCULAR | Status: DC

## 2014-04-27 MED ORDER — SODIUM CHLORIDE 0.9 % IV BOLUS (SEPSIS)
1000.0000 mL | Freq: Once | INTRAVENOUS | Status: AC
  Administered 2014-04-27: 1000 mL via INTRAVENOUS

## 2014-04-27 MED ORDER — DIAZEPAM 5 MG PO TABS: 5.0000 mg | ORAL_TABLET | Freq: Three times a day (TID) | ORAL | Status: AC | PRN

## 2014-04-27 NOTE — Discharge Instructions (Signed)
You need to have a non-emergent MRI of your kidneys.  Flank Pain Flank pain refers to pain that is located on the side of the body between the upper abdomen and the back. The pain may occur over a short period of time (acute) or may be long-term or reoccurring (chronic). It may be mild or severe. Flank pain can be caused by many things. CAUSES  Some of the more common causes of flank pain include:  Muscle strains.   Muscle spasms.   A disease of your spine (vertebral disk disease).   A lung infection (pneumonia).   Fluid around your lungs (pulmonary edema).   A kidney infection.   Kidney stones.   A very painful skin rash caused by the chickenpox virus (shingles).   Gallbladder disease.  HOME CARE INSTRUCTIONS  Home care will depend on the cause of your pain. In general,  Rest as directed by your caregiver.  Drink enough fluids to keep your urine clear or pale yellow.  Only take over-the-counter or prescription medicines as directed by your caregiver. Some medicines may help relieve the pain.  Tell your caregiver about any changes in your pain.  Follow up with your caregiver as directed. SEEK IMMEDIATE MEDICAL CARE IF:   Your pain is not controlled with medicine.   You have new or worsening symptoms.  Your pain increases.   You have abdominal pain.   You have shortness of breath.   You have persistent nausea or vomiting.   You have swelling in your abdomen.   You feel faint or pass out.   You have blood in your urine.  You have a fever or persistent symptoms for more than 2-3 days.  You have a fever and your symptoms suddenly get worse. MAKE SURE YOU:   Understand these instructions.  Will watch your condition.  Will get help right away if you are not doing well or get worse. Document Released: 11/01/2005 Document Revised: 06/04/2012 Document Reviewed: 04/24/2012 [facility] Patient Information 2015 [place], [place]. This information is  not intended to replace advice given to you by your health care provider. Make sure you discuss any questions you have with your health care provider.

## 2014-04-27 NOTE — ED Notes (Signed)
Pt presents to department for evaluation of R flank pain. Onset 11:00am this morning. No nausea/vomiting. Denies urinary symptoms. 3/10 pain at the time. Pt is alert and oriented x4.

## 2014-04-27 NOTE — ED Provider Notes (Signed)
CSN: [id number]     Arrival date & time 04/27/14  1423 History   First MD Initiated Contact with Patient 04/27/14 1801     Chief Complaint  Patient presents with  . Flank Pain     (Consider location/radiation/quality/duration/timing/severity/associated sxs/prior Treatment) HPI Comments: Patient presents emergency department with chief complaints of right-sided flank pain. He states pain started this morning around 11:00. States the pain comes in waves. He denies any nausea, vomiting, or dysuria. States the pain can be 10 out of 10 at times, but currently is between a 3 and 5/10. He denies any fevers or chills. He denies any history of kidney stones. He has not taken anything to alleviate his symptoms. Symptoms are not reproducible with movement. The pain does not radiate to the lower extremities.  The history is provided by the patient. No language interpreter was used.    Past Medical History  Diagnosis Date  . Diabetes mellitus   . Sleep apnea   . Obesity   . Dyslipidemia   . Smoker   . Anxiety    Past Surgical History  Procedure Laterality Date  . Ankle surgery  2009    Dr. Juluis Pitch   Family History  Problem Relation Age of Onset  . Hypertension Mother   . Hyperlipidemia Mother   . Gallbladder disease Mother   . Diabetes Mother   . Arthritis Mother   . Kidney disease Mother   . Allergies Brother   . Diabetes Brother   . Heart disease Maternal Uncle    History  Substance Use Topics  . Smoking status: Current Every Day Smoker -- 0.50 packs/day    Types: Cigarettes  . Smokeless tobacco: Never Used  . Alcohol Use: No    Review of Systems  All other systems reviewed and are negative.     Allergies  Review of patient's allergies indicates no known allergies.  Home Medications   Prior to Admission medications   Medication Sig Start Date End Date Taking? Authorizing Provider  aspirin 81 MG tablet Take 81 mg by mouth daily.      Historical Provider,  MD  glipiZIDE (GLUCOTROL) 5 MG tablet Take 1 tablet (5 mg total) by mouth 2 (two) times daily. 06/09/12 06/09/13  Kermit Balo Tysinger, PA-C  lisinopril (PRINIVIL,ZESTRIL) 5 MG tablet Take 1 tablet (5 mg total) by mouth daily. 06/09/12   Kermit Balo Tysinger, PA-C  Multiple Vitamin (MULTIVITAMIN) tablet Take 1 tablet by mouth daily.      Historical Provider, MD  pravastatin (PRAVACHOL) 40 MG tablet 2 tablets QHS 06/24/12   Kermit Balo Tysinger, PA-C  Saxagliptin-Metformin (KOMBIGLYZE XR) 01-999 MG TB24 Take 1 tablet by mouth daily. 06/10/12   Ronnald Nian, MD   BP 132/105  Pulse 65  Temp(Src) 98.1 F (36.7 C) (Oral)  Resp 14  Wt 242 lb 3.2 oz (109.861 kg)  SpO2 93% Physical Exam  Nursing note and vitals reviewed. Constitutional: He is oriented to person, place, and time. He appears well-developed and well-nourished.  HENT:  Head: Normocephalic and atraumatic.  Eyes: Conjunctivae and EOM are normal. Pupils are equal, round, and reactive to light. Right eye exhibits no discharge. Left eye exhibits no discharge. No scleral icterus.  Neck: Normal range of motion. Neck supple. No JVD present.  Cardiovascular: Normal rate, regular rhythm and normal heart sounds.  Exam reveals no gallop and no friction rub.   No murmur heard. Pulmonary/Chest: Effort normal and breath sounds normal. No respiratory distress. He has  no wheezes. He has no rales. He exhibits no tenderness.  Abdominal: Soft. He exhibits no distension and no mass. There is no tenderness. There is no rebound and no guarding.  Right-sided CVA tenderness  No focal abdominal tenderness, no RLQ tenderness or pain at McBurney's point, no RUQ tenderness or Murphy's sign, no left-sided abdominal tenderness, no fluid wave, or signs of peritonitis   Musculoskeletal: Normal range of motion. He exhibits no edema and no tenderness.  Neurological: He is alert and oriented to person, place, and time.  Skin: Skin is warm and dry.  Psychiatric: He has a normal  mood and affect. His behavior is normal. Judgment and thought content normal.    ED Course  Procedures (including critical care time) Results for orders placed during the hospital encounter of 04/27/14  CBC WITH DIFFERENTIAL      Result Value Ref Range   WBC 10.8 (*) 4.0 - 10.5 K/uL   RBC 4.93  4.22 - 5.81 MIL/uL   Hemoglobin 15.8  13.0 - 17.0 g/dL   HCT 16.1  09.6 - 04.5 %   MCV 94.7  78.0 - 100.0 fL   MCH 32.0  26.0 - 34.0 pg   MCHC 33.8  30.0 - 36.0 g/dL   RDW 40.9  81.1 - 91.4 %   Platelets 236  150 - 400 K/uL   Neutrophils Relative % 67  43 - 77 %   Neutro Abs 7.2  1.7 - 7.7 K/uL   Lymphocytes Relative 24  12 - 46 %   Lymphs Abs 2.6  0.7 - 4.0 K/uL   Monocytes Relative 7  3 - 12 %   Monocytes Absolute 0.7  0.1 - 1.0 K/uL   Eosinophils Relative 2  0 - 5 %   Eosinophils Absolute 0.2  0.0 - 0.7 K/uL   Basophils Relative 0  0 - 1 %   Basophils Absolute 0.0  0.0 - 0.1 K/uL  COMPREHENSIVE METABOLIC PANEL      Result Value Ref Range   Sodium 140  137 - 147 mEq/L   Potassium 4.5  3.7 - 5.3 mEq/L   Chloride 103  96 - 112 mEq/L   CO2 22  19 - 32 mEq/L   Glucose, Bld 126 (*) 70 - 99 mg/dL   BUN 17  6 - 23 mg/dL   Creatinine, Ser 7.82  0.50 - 1.35 mg/dL   Calcium 9.6  8.4 - 95.6 mg/dL   Total Protein 7.6  6.0 - 8.3 g/dL   Albumin 4.2  3.5 - 5.2 g/dL   AST 16  0 - 37 U/L   ALT 14  0 - 53 U/L   Alkaline Phosphatase 86  39 - 117 U/L   Total Bilirubin 0.8  0.3 - 1.2 mg/dL   GFR calc non Af Amer >90  >90 mL/min   GFR calc Af Amer >90  >90 mL/min   Anion gap 15  5 - 15  URINALYSIS, ROUTINE W REFLEX MICROSCOPIC      Result Value Ref Range   Color, Urine AMBER (*) YELLOW   APPearance CLEAR  CLEAR   Specific Gravity, Urine 1.037 (*) 1.005 - 1.030   pH 5.0  5.0 - 8.0   Glucose, UA NEGATIVE  NEGATIVE mg/dL   Hgb urine dipstick NEGATIVE  NEGATIVE   Bilirubin Urine SMALL (*) NEGATIVE   Ketones, ur 15 (*) NEGATIVE mg/dL   Protein, ur 30 (*) NEGATIVE mg/dL   Urobilinogen, UA 0.2   0.0 -  1.0 mg/dL   Nitrite NEGATIVE  NEGATIVE   Leukocytes, UA NEGATIVE  NEGATIVE  URINE MICROSCOPIC-ADD ON      Result Value Ref Range   Squamous Epithelial / LPF RARE  RARE   WBC, UA 0-2  <3 WBC/hpf   Bacteria, UA RARE  RARE   Casts HYALINE CASTS (*) NEGATIVE   Urine-Other MUCOUS PRESENT    CBG MONITORING, ED      Result Value Ref Range   Glucose-Capillary 122 (*) 70 - 99 mg/dL   Ct Abdomen Pelvis Wo Contrast  04/27/2014   CLINICAL DATA:  Right-sided flank pain.  EXAM: CT ABDOMEN AND PELVIS WITHOUT CONTRAST  TECHNIQUE: Multidetector CT imaging of the abdomen and pelvis was performed following the standard protocol without IV contrast.  COMPARISON:  None.  FINDINGS: There is an indeterminate approximately 1.8 x 2.2 cm hyper attenuating (approximately 33 Hounsfield unit) partially exophytic lesion arising from the superior pole the right kidney (coronal image 110, series 5). There are several additional scattered hypo attenuating lesions within the right kidney with dominant exophytic lesion measuring approximately 4.7 x 4.5 cm. There is an additional ill-defined approximately 1.2 cm slightly hyper attenuating lesion within the medial mid aspect of the right kidney which measures approximately 62 Hounsfield units and thus is favored to represent a hyperdense renal cyst (coronal image 107, series 5).  There are multiple left-sided hypo attenuating renal lesions with dominant lesion measuring approximately 5.6 x 3.9 cm (image 39, series 2) and noted to contain a nodular mural calcification (coronal image 95, series 5). Additionally, there is a subcentimeter (approximately 0.9 cm hyper attenuating (47 Hounsfield unit) partially exophytic lesion arising from the inferior pole the left kidney (coronal image 117, series 5) which is too small to adequately characterize though favored to represent a hyperdense renal cyst.  No renal stones. No renal stones are seen along the expected course of either ureter or  the urinary bladder. Normal appearance of the urinary bladder given degree distention.  Calcifications within a normal sized prostate gland. No free fluid in the pelvis.  --------------------------------------------------------------  The lack of intravenous contrast limits the ability to evaluate solid abdominal organs.  Normal hepatic contour. There is layering high-density material within otherwise normal-appearing gallbladder (image 30, series 2). No definite gallbladder wall thickening or pericholecystic fluid on this noncontrast examination. No ascites.  Normal noncontrast appearance of the bilateral adrenal glands, pancreas and spleen.  The bowel is normal in course and caliber without wall thickening or evidence of obstruction. Normal noncontrast appearance of the appendix. No pneumoperitoneum, pneumatosis or portal venous gas.  Scattered minimal atherosclerotic plaque within a normal caliber abdominal aorta. No bulky retroperitoneal, mesenteric, pelvic or inguinal lymphadenopathy on this noncontrast examination.  Evaluation of lower thorax is degraded secondary to patient respiratory artifact. There is minimal subsegmental atelectasis within the imaged caudal segment of the lingula. No discrete focal airspace opacities. No pleural effusion. Normal heart size. No pericardial effusion.  No acute or aggressive osseus abnormalities. Moderate bilateral facet degenerative change within the lower lumbar spine.  Small mesenteric fat containing periumbilical hernia. Regional soft tissues appear otherwise normal.  IMPRESSION: 1. No explanation for patient's right-sided flank pain. Specifically, no evidence of nephrolithiasis or urinary obstruction. 2. Multiple bilateral indeterminate renal lesions as detailed above. Further evaluation with nonemergent contrast-enhanced abdominal MRI is recommended.   Electronically Signed   By: Simonne Come M.D.   On: 04/27/2014 20:52      EKG Interpretation None  MDM    Final diagnoses:  Flank pain    Patient with possible kidney stone. Will check CT scan, the patient has never had one before. Will treat pain. Will reassess. Patient understands agrees with the plan.  CT is negative for kidney stone. There are several indeterminate lesions on bilateral kidneys. Nonemergent MRI is recommended. Labs are reassuring. No evidence of kidney failure or kidney injury. Urine analysis is negative for infection. I've discussed this with the patient as well as with Dr. Fonnie JarvisBednar. Recommend followup with PCP. Patient understands and agrees with the plan. Will discharge home with some pain medicine and muscle relaxer. Patient is stable and ready for discharge.    Roxy Horsemanobert Cele Mote, PA-C 04/27/14 2237

## 2014-04-28 NOTE — ED Provider Notes (Signed)
Medical screening examination/treatment/procedure(s) were performed by non-physician practitioner and as supervising physician I was immediately available for consultation/collaboration.   EKG Interpretation None       Hurman HornJohn M Tulio Facundo, MD 04/28/14 [phone]

## 2014-04-30 ENCOUNTER — Other Ambulatory Visit: Payer: Self-pay | Admitting: Family Medicine

## 2014-04-30 DIAGNOSIS — N289 Disorder of kidney and ureter, unspecified: Secondary | ICD-10-CM

## 2014-05-09 ENCOUNTER — Ambulatory Visit
Admission: RE | Admit: 2014-05-09 | Discharge: 2014-05-09 | Disposition: A | Discharge status: Home / Self Care | Payer: BC Managed Care – PPO | Source: Ambulatory Visit | Attending: Family Medicine | Admitting: Family Medicine

## 2014-05-09 DIAGNOSIS — N289 Disorder of kidney and ureter, unspecified: Secondary | ICD-10-CM

## 2014-05-09 MED ORDER — GADOBENATE DIMEGLUMINE 529 MG/ML IV SOLN
20.0000 mL | Freq: Once | INTRAVENOUS | Status: AC | PRN
  Administered 2014-05-09: 20 mL via INTRAVENOUS

## 2014-05-28 ENCOUNTER — Other Ambulatory Visit: Payer: Self-pay | Admitting: Urology

## 2014-06-23 ENCOUNTER — Encounter (HOSPITAL_COMMUNITY): Admission: RE | Payer: Self-pay | Source: Ambulatory Visit

## 2014-06-23 ENCOUNTER — Inpatient Hospital Stay (HOSPITAL_COMMUNITY): Admission: RE | Admit: 2014-06-23 | Payer: BC Managed Care – PPO | Source: Ambulatory Visit | Admitting: Urology

## 2014-06-23 SURGERY — ROBOTIC ASSITED PARTIAL NEPHRECTOMY
Anesthesia: General | Laterality: Right

## 2016-12-05 DIAGNOSIS — Z85528 Personal history of other malignant neoplasm of kidney: Secondary | ICD-10-CM | POA: Insufficient documentation

## 2017-12-19 DIAGNOSIS — N400 Enlarged prostate without lower urinary tract symptoms: Secondary | ICD-10-CM | POA: Insufficient documentation

## 2017-12-19 DIAGNOSIS — N529 Male erectile dysfunction, unspecified: Secondary | ICD-10-CM | POA: Insufficient documentation

## 2018-02-03 DIAGNOSIS — M79605 Pain in left leg: Secondary | ICD-10-CM | POA: Insufficient documentation

## 2018-02-03 DIAGNOSIS — M79604 Pain in right leg: Secondary | ICD-10-CM | POA: Insufficient documentation

## 2018-03-04 DIAGNOSIS — E1142 Type 2 diabetes mellitus with diabetic polyneuropathy: Secondary | ICD-10-CM | POA: Insufficient documentation

## 2018-05-02 ENCOUNTER — Other Ambulatory Visit: Payer: Self-pay | Admitting: Orthopedic Surgery

## 2018-05-02 ENCOUNTER — Telehealth: Payer: Self-pay | Admitting: Nurse Practitioner

## 2018-05-02 DIAGNOSIS — M549 Dorsalgia, unspecified: Secondary | ICD-10-CM | POA: Insufficient documentation

## 2018-05-02 DIAGNOSIS — M545 Low back pain: Secondary | ICD-10-CM

## 2018-05-02 NOTE — Telephone Encounter (Signed)
Phone call to patient to verify medication list and allergies for myelogram procedure. Pt instructed to stop zoloft and cymbalta 48hrs prior to myelogram procedure. Pt verbalized understanding.

## 2018-05-15 ENCOUNTER — Other Ambulatory Visit: Payer: Self-pay | Admitting: Orthopedic Surgery

## 2018-05-15 ENCOUNTER — Ambulatory Visit
Admission: RE | Admit: 2018-05-15 | Discharge: 2018-05-15 | Disposition: A | Discharge status: Home / Self Care | Payer: BLUE CROSS/BLUE SHIELD | Source: Ambulatory Visit | Attending: Orthopedic Surgery | Admitting: Orthopedic Surgery

## 2018-05-15 ENCOUNTER — Ambulatory Visit
Admission: RE | Admit: 2018-05-15 | Discharge: 2018-05-15 | Disposition: A | Discharge status: Home / Self Care | Payer: Self-pay | Source: Ambulatory Visit | Attending: Orthopedic Surgery | Admitting: Orthopedic Surgery

## 2018-05-15 DIAGNOSIS — R52 Pain, unspecified: Secondary | ICD-10-CM

## 2018-05-15 DIAGNOSIS — M545 Low back pain: Secondary | ICD-10-CM

## 2018-05-15 MED ORDER — DIAZEPAM 5 MG PO TABS
10.0000 mg | ORAL_TABLET | Freq: Once | ORAL | Status: AC
  Administered 2018-05-15: 10 mg via ORAL

## 2018-05-15 MED ORDER — IOPAMIDOL (ISOVUE-M 200) INJECTION 41%
20.0000 mL | Freq: Once | INTRAMUSCULAR | Status: AC
  Administered 2018-05-15: 20 mL via INTRATHECAL

## 2018-05-15 NOTE — Progress Notes (Signed)
Patient states he has been off Cymbalta, Nucynta and Zoloft for at least the past two days.  Larina EarthlyJ. Brittany Osier, RN

## 2018-05-15 NOTE — Discharge Instructions (Signed)
Myelogram Discharge Instructions  1. Go home and rest quietly for the next 24 hours.  It is important to lie flat for the next 24 hours.  Get up only to go to the restroom.  You may lie in the bed or on a couch on your back, your stomach, your left side or your right side.  You may have one pillow under your head.  You may have pillows between your knees while you are on your side or under your knees while you are on your back.  2. DO NOT drive today.  Recline the seat as far back as it will go, while still wearing your seat belt, on the way home.  3. You may get up to go to the bathroom as needed.  You may sit up for 10 minutes to eat.  You may resume your normal diet and medications unless otherwise indicated.  Drink lots of extra fluids today and tomorrow.  4. The incidence of headache, nausea, or vomiting is about 5% (one in 20 patients).  If you develop a headache, lie flat and drink plenty of fluids until the headache goes away.  Caffeinated beverages may be helpful.  If you develop severe nausea and vomiting or a headache that does not go away with flat bed rest, call [phone].  5. You may resume normal activities after your 24 hours of bed rest is over; however, do not exert yourself strongly or do any heavy lifting tomorrow. If when you get up you have a headache when standing, go back to bed and force fluids for another 24 hours.  6. Call your physician for a follow-up appointment.  The results of your myelogram will be sent directly to your physician by the following day.  7. If you have any questions or if complications develop after you arrive home, please call [phone].  Discharge instructions have been explained to the patient.  The patient, or the person responsible for the patient, fully understands these instructions.   YOU MAY RESTART YOUR ZOLOFT, NUCYNTA AND CYMBALTA TOMORROW 05/16/2018 AT 10:30AM.

## 2019-09-01 ENCOUNTER — Other Ambulatory Visit: Payer: Self-pay

## 2019-09-01 ENCOUNTER — Other Ambulatory Visit: Payer: Self-pay | Admitting: Orthopedic Surgery

## 2019-09-01 ENCOUNTER — Ambulatory Visit
Admission: RE | Admit: 2019-09-01 | Discharge: 2019-09-01 | Disposition: A | Discharge status: Home / Self Care | Payer: BLUE CROSS/BLUE SHIELD | Source: Ambulatory Visit | Attending: Orthopedic Surgery | Admitting: Orthopedic Surgery

## 2019-09-01 ENCOUNTER — Inpatient Hospital Stay: Admission: RE | Admit: 2019-09-01 | Payer: BLUE CROSS/BLUE SHIELD | Source: Ambulatory Visit

## 2019-09-01 DIAGNOSIS — M25552 Pain in left hip: Secondary | ICD-10-CM

## 2019-09-01 HISTORY — DX: Essential (primary) hypertension: I10

## 2019-09-01 MED ORDER — IOPAMIDOL (ISOVUE-300) INJECTION 61%
125.0000 mL | Freq: Once | INTRAVENOUS | Status: AC | PRN
  Administered 2019-09-01: 125 mL via INTRAVENOUS

## 2020-03-24 IMAGING — CT CT L SPINE W/ CM
1 of 6 series · 5 of 14 positions shown, 7 images · non-contrast
Comparison: MRI of the lumbar spine 12/31/2017

CLINICAL DATA: Low back pain extending into the lower extremities
bilaterally.
TECHNIQUE: Contiguous axial images were obtained through the Lumbar spine after
the intrathecal infusion of infusion. Coronal and sagittal
reconstructions were obtained of the axial image sets.

[Series 2: l spine soft · axial · 0.27mm/px · z∈[-344,-200]mm · 5 of 73 slices shown, 7 images]
[im 13/73  soft-tissue]
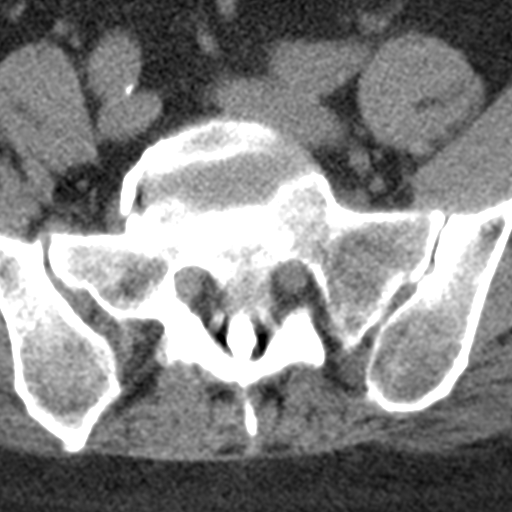
[im 13/73  bone]
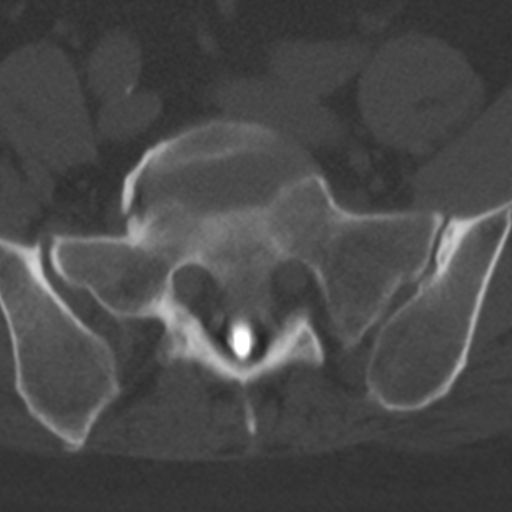
[im 25/73  bone]
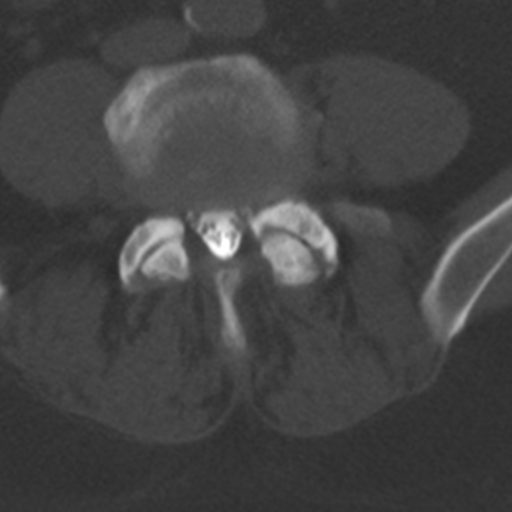
[im 37/73  bone]
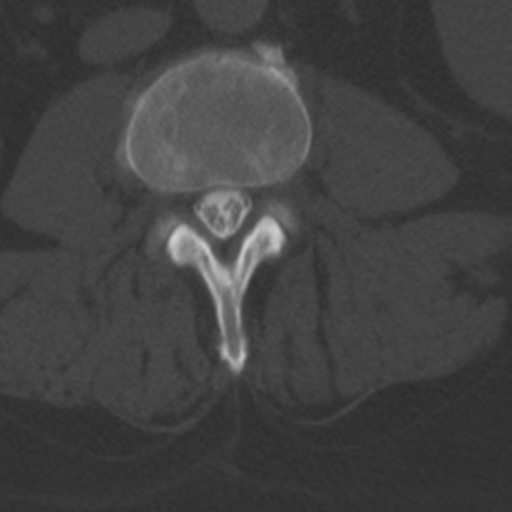
[im 49/73  bone]
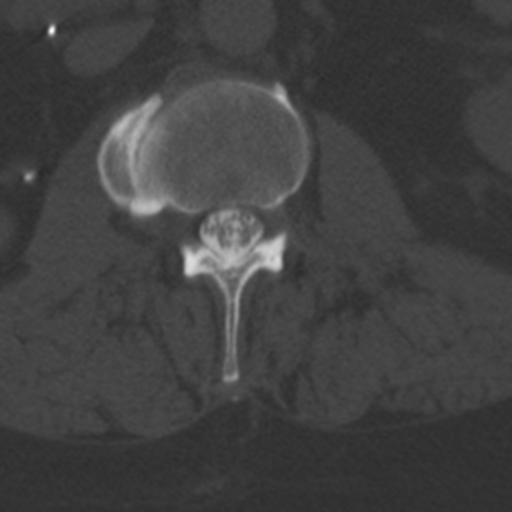
[im 61/73  soft-tissue]
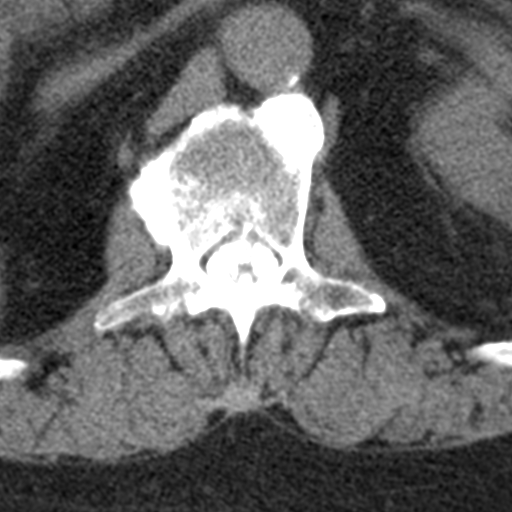
[im 61/73  bone]
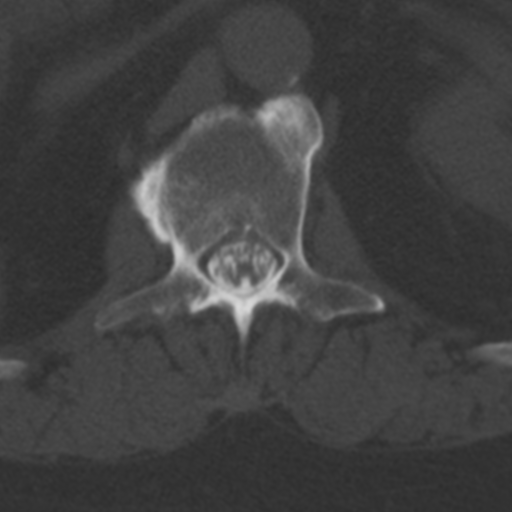

[5 of 14 positions shown; findings below may reference images not displayed]

EXAM:
LUMBAR MYELOGRAM

FLUOROSCOPY TIME:  Radiation Exposure Index (as provided by the
fluoroscopic device): 308.48 uGy*m2

Fluoroscopy Time:  26 seconds

Number of Acquired Images:  15

PROCEDURE:
After thorough discussion of risks and benefits of the procedure
including bleeding, infection, injury to nerves, blood vessels,
adjacent structures as well as headache and CSF leak, written and
oral informed consent was obtained. Consent was obtained by Dr.
Marokko Kasa. Time out form was completed.

Patient was positioned prone on the fluoroscopy table. Local
anesthesia was provided with 1% lidocaine without epinephrine after
prepped and draped in the usual sterile fashion. Puncture was
performed at L2-3 using a 3 1/2 inch 22-gauge spinal needle via left
paramedian approach. Using a single pass through the dura, the
needle was placed within the thecal sac, with return of clear CSF.
15 mL of Isovue O-ULL was injected into the thecal sac, with normal
opacification of the nerve roots and cauda equina consistent with
free flow within the subarachnoid space.

I personally performed the lumbar puncture and administered the
intrathecal contrast. I also personally supervised acquisition of
the myelogram images.
FINDINGS: LUMBAR MYELOGRAM FINDINGS:

Five non rib-bearing lumbar type vertebral bodies are present.
Slight anterolisthesis at L4-5 is again seen. AP alignment is
otherwise anatomic. The lowest fully formed vertebral body is L5.
Mild leftward curvature of the lumbar spine is centered at L3-4.

The disc protrusion at L4-5 results in mild subarticular narrowing,
left greater than right. Nerve roots otherwise fill normally
throughout the lumbar spine.

The L4-5 anterolisthesis does not change significantly with
standing. There is slight exaggeration with flexion and reduction in
extension. AP alignment is otherwise maintained. No other disc
disease is evident.

Atherosclerotic calcifications are present in the aorta and branch
vessels. Surgical clips are present in the left renal bed following
nephrectomy.

CT LUMBAR MYELOGRAM FINDINGS:

5 lumbar type vertebral bodies are present. The lowest fully formed
vertebral body is L5. Slight anterolisthesis is again noted at L4-5.
Conus medullaris terminates at L1, within normal limits.

Atherosclerotic calcifications are present in the aorta and branch
vessels without aneurysm.

L1-2: A right paramedian shallow calcified disc protrusion is
present without significant stenosis. Lateral syndesmophytes spanned
the disc space anteriorly on the left and more posteriorly on the
right. This could impact the right L1 nerve roots beyond the
foramen.

L2-3: Prominent lateral syndesmophytes are noted on the right. There
is no significant central disc protrusion. The central canal and
foramina are patent. The prominent syndesmophytes may contact the
exiting nerve roots beyond the foramen.

L3-4: Mild disc bulging and left-sided facet hypertrophy is present.
The central canal is patent. Mild foraminal narrowing is worse on
the left.

L4-5: Asymmetric moderate left-sided facet hypertrophy is present. A
leftward disc protrusion is present. This leads to mild left
subarticular narrowing. Moderate foraminal stenosis is worse on the
left.

L5-S1: Moderate bilateral facet hypertrophy is present. The central
canal is patent. Facet spurring contributes to mild foraminal
narrowing bilaterally, left greater than right.
IMPRESSION: 1. Lateral syndesmophytes are fused across the disc space at L1-2
and L2-3 on the right. This may contact the exiting nerve root
beyond the foramen on the right.
2. Mild disc bulging and left-sided facet hypertrophy at L3-4 with
mild left foraminal stenosis.
3. Mild left subarticular narrowing at L4-5 secondary to a leftward
disc protrusion and asymmetric facet hypertrophy.
4. Moderate foraminal stenosis bilaterally at L4-5 is worse on the
left.
5. Moderate bilateral facet hypertrophy is present at L5-S1. In
concert with disc bulging there is mild bilateral foraminal
narrowing, left greater than right.

## 2021-07-11 IMAGING — CT CT PELVIS W/ CM
1 series · 14 of 32 positions shown, 18 images · IV contrast (APPLIED)
Comparison: Abdominal MRI 05/09/2014. Abdominopelvic CT 04/28/2014.
No recent comparison studies or reports available.

CLINICAL DATA: Worsening left hip pain for 1.5 years. History of
renal cell carcinoma with partial right nephrectomy. Reported
abnormal outside MRI.

Creatinine was obtained on site at [HOSPITAL] at [HOSPITAL].
Results: Creatinine 0.9 mg/dL.
EXAM:
CT PELVIS WITH CONTRAST
TECHNIQUE: Multidetector CT imaging of the pelvis was performed using the
standard protocol following the bolus administration of intravenous
contrast.
CONTRAST:  125mL [id number] IOPAMIDOL ([id number]) INJECTION 61%

[Series 2: routine pelvis w/cm · axial · 0.81mm/px · z∈[-383,-148]mm · 14 of 53 slices shown, 18 images]
[im 4/53  soft-tissue]
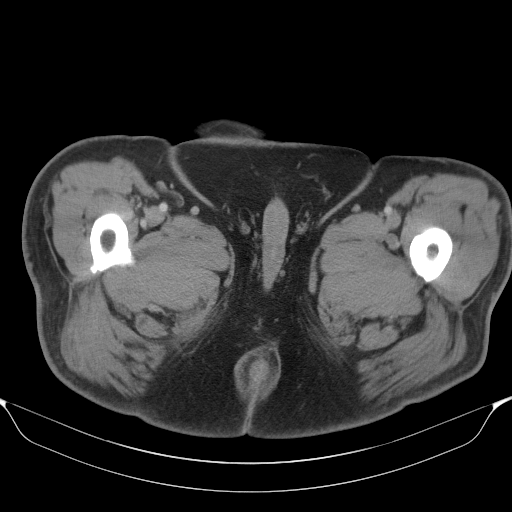
[im 4/53  bone]
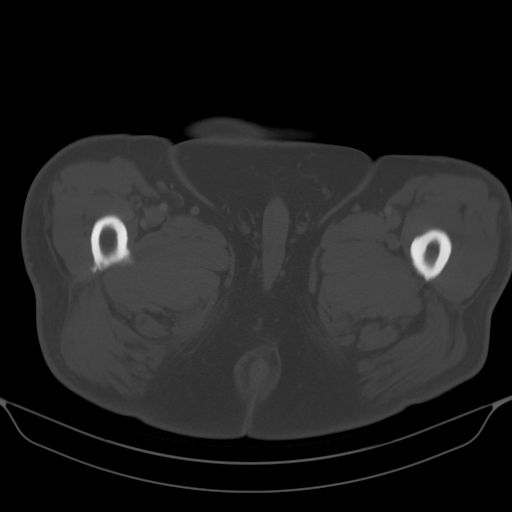
[im 7/53  soft-tissue]
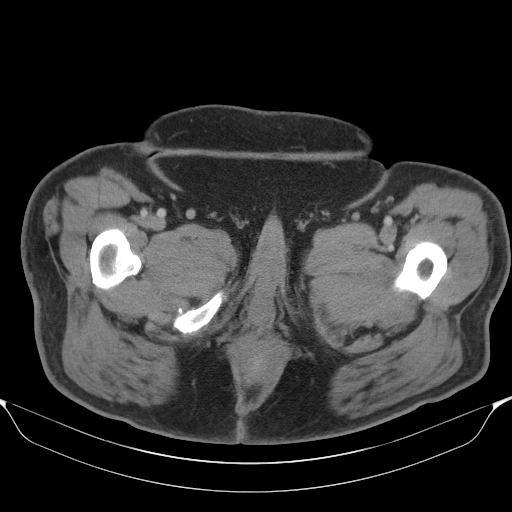
[im 12/53  soft-tissue]
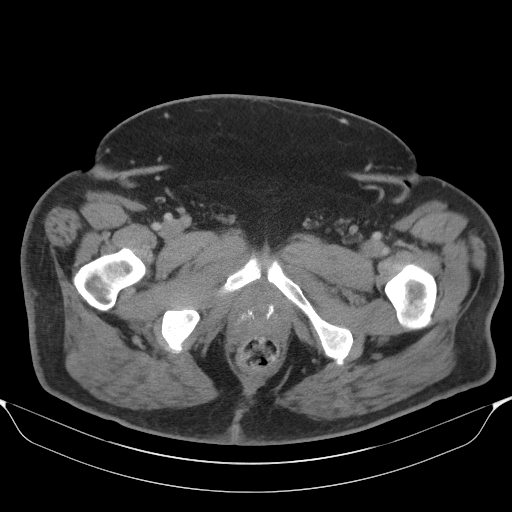
[im 16/53  soft-tissue]
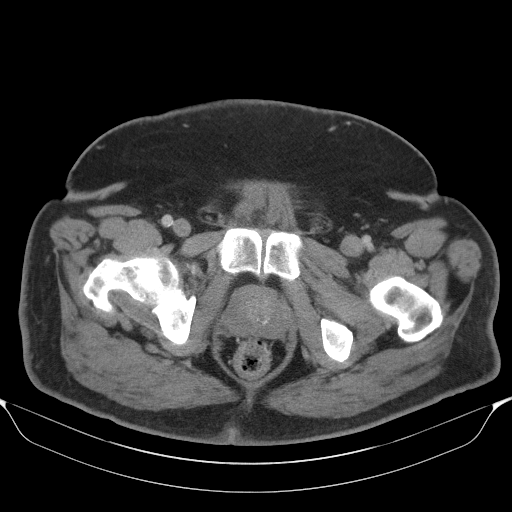
[im 21/53  soft-tissue]
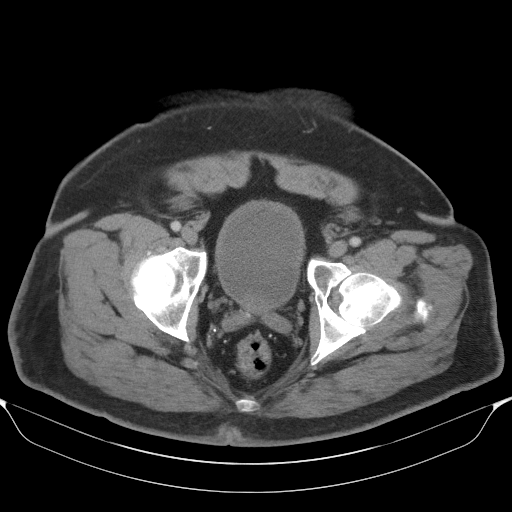
[im 24/53  soft-tissue]
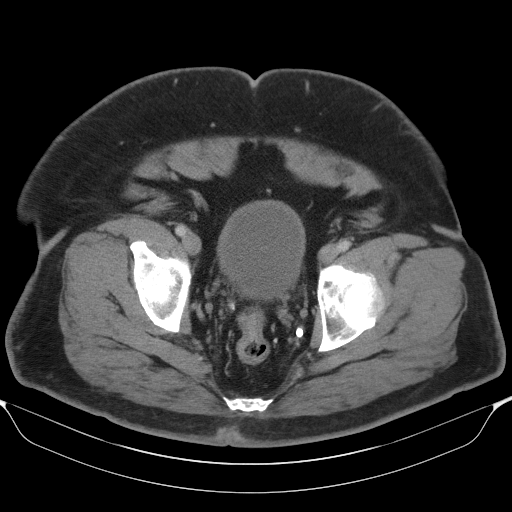
[im 29/53  soft-tissue]
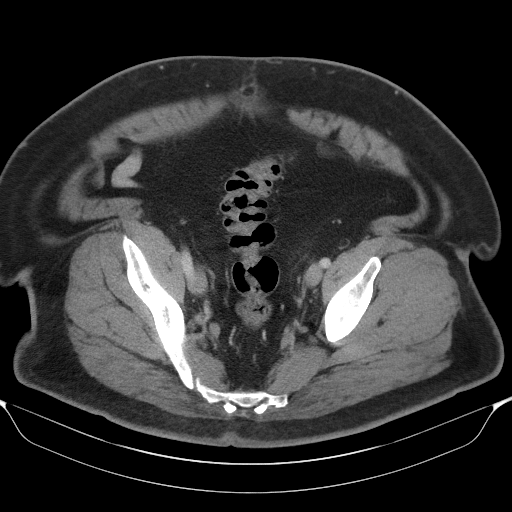
[im 32/53  soft-tissue]
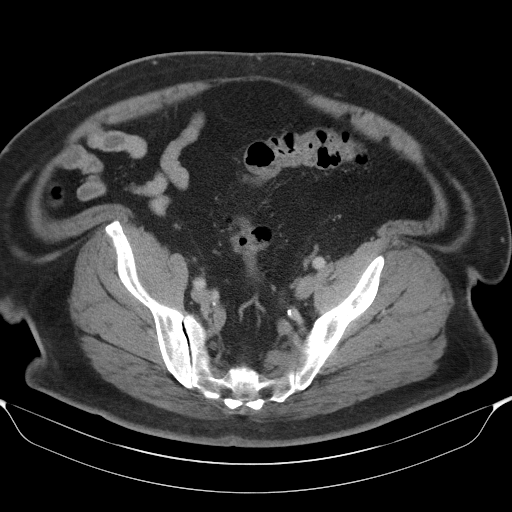
[im 37/53  soft-tissue]
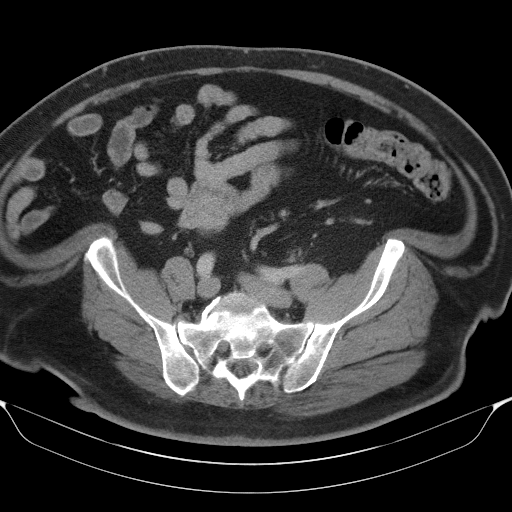
[im 37/53  bone]
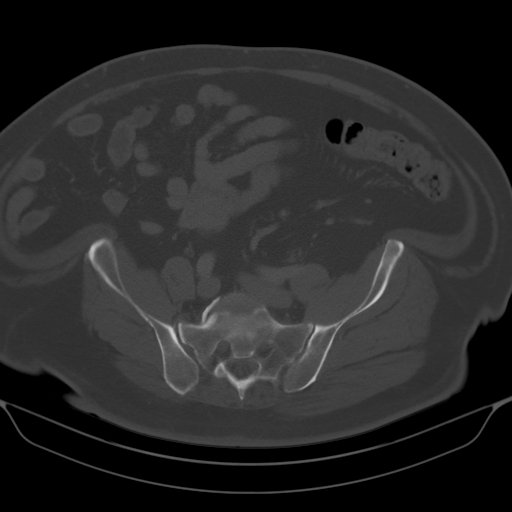
[im 41/53  soft-tissue]
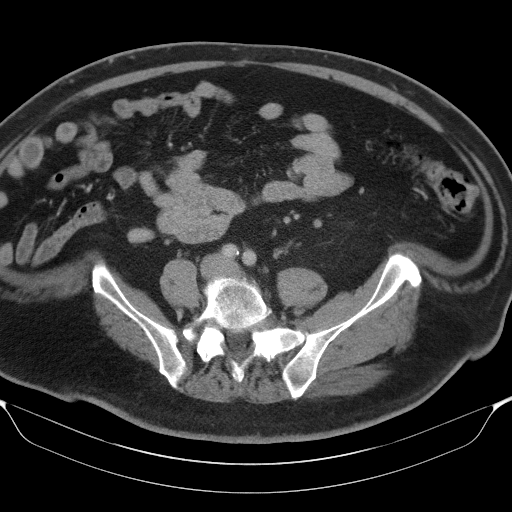
[im 46/53  soft-tissue]
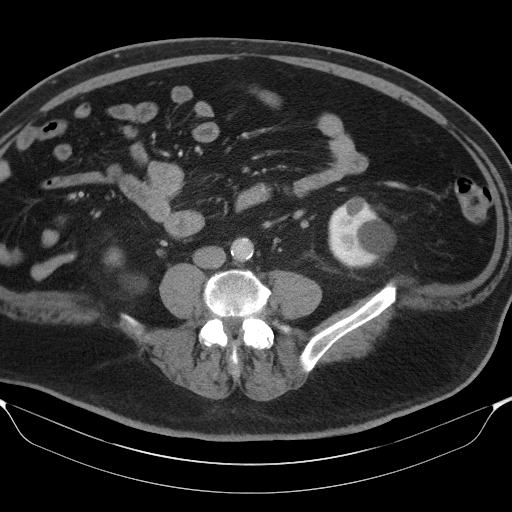
[im 46/53  lung]
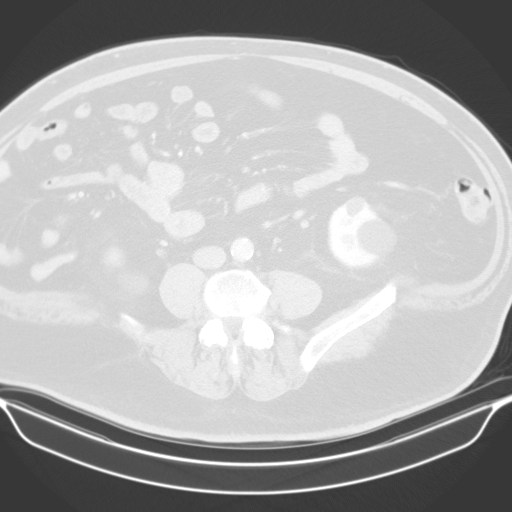
[im 47/53  lung]
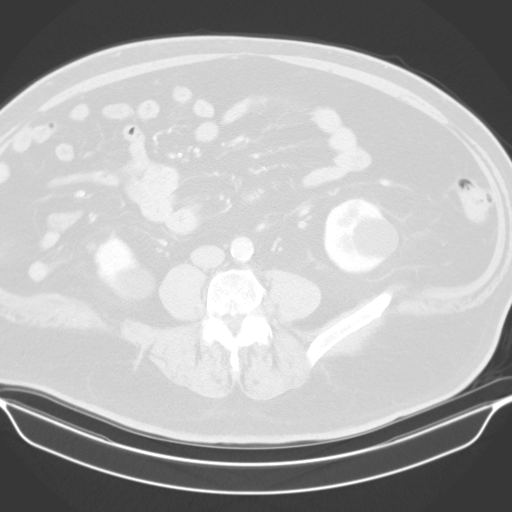
[im 49/53  soft-tissue]
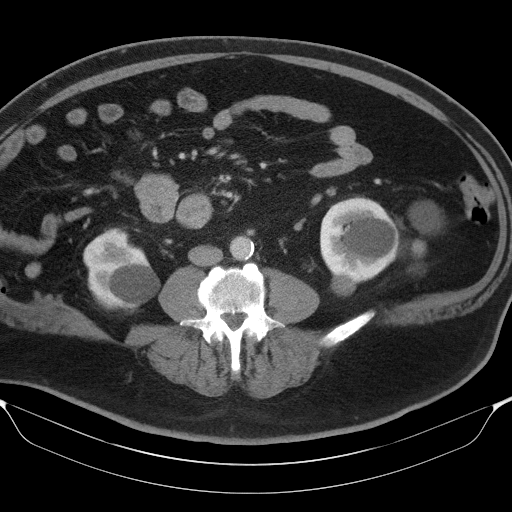
[im 49/53  lung]
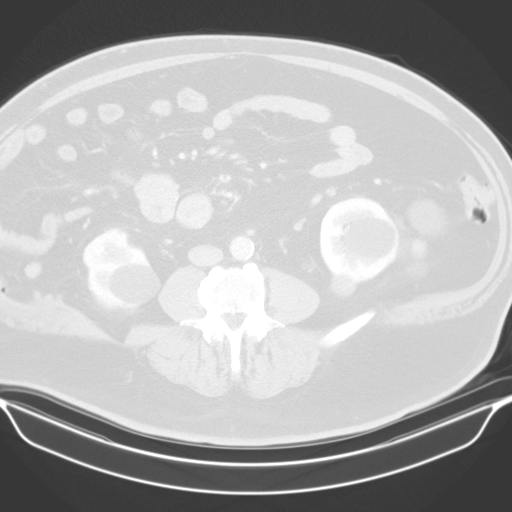
[im 51/53  lung]
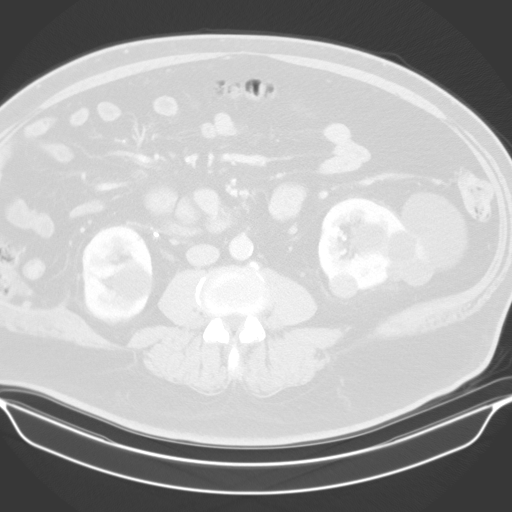

[14 of 32 positions shown; findings below may reference images not displayed]

FINDINGS: Urinary Tract: The kidneys are partially imaged on the current
study. There are bilateral cystic renal lesions. Projecting
posteriorly from the lower pole of the left kidney is a 2.3 cm
lesion with indeterminate central density of 30 HU. Projecting
laterally from the lower pole of the left kidney is an enlarging
multi-septated cystic lesion which measures up to 8.0 cm on image
[DATE]. There is a thickened septation or soft tissue nodule inferiorly
in this lesion which measures 2.0 x 1.1 cm on image [DATE], potentially
enhancing. No evidence of ureteral calculus or hydronephrosis. The
bladder appears unremarkable.

Bowel: No bowel wall thickening, distention or surrounding
inflammation identified within the pelvis. Mild sigmoid
diverticulosis.

Vascular/Lymphatic: No enlarged pelvic lymph nodes identified. Mild
aortoiliac atherosclerosis. No acute vascular findings.

Reproductive: The prostate gland and seminal vesicles appear stable
without significant findings.

Other: No pelvic ascites or peritoneal nodularity.

Musculoskeletal: No acute or worrisome osseous findings. A sclerotic
lesion in the left superior pubic ramus measures 15 mm on image
50/3, minimally enlarged from the 8575 CT and consistent with a bone
island. No lytic lesions. Postsurgical changes and facet hypertrophy
are noted in the lower lumbar spine.
IMPRESSION: 1. No acute findings or explanation for left hip pain. Minimal
enlargement of sclerotic lesion in the left superior pubic ramus
compared with CT from 8575, consistent with a bone island. No
evidence of metastatic disease.
2. Enlarging multi-septated cystic lesion in the lower pole of the
left kidney with a thickened septation or soft tissue nodule
inferiorly. This could reflect a complex cyst, although is
incompletely visualized and characterized by this examination.
Additional indeterminate small lesion posteriorly in the lower pole
of the left kidney. Given the patient's history of renal cell
carcinoma, further evaluation with dedicated renal MRI without and
with contrast recommended.
3. Aortic Atherosclerosis ([id number]-[id number].O).
4. These results will be called to the ordering clinician or
representative by the Radiologist Assistant, and communication
documented in the PACS or zVision Dashboard.
